# Patient Record
Sex: Female | Born: 1995 | Race: White | Hispanic: No | Marital: Single | Smoking: Never smoker
Health system: Southern US, Community
[De-identification: ages and names within clinical notes are randomized; demographics above are authoritative.]

## PROBLEM LIST (undated history)

## (undated) DIAGNOSIS — K5909 Other constipation: Secondary | ICD-10-CM

## (undated) DIAGNOSIS — J45909 Unspecified asthma, uncomplicated: Secondary | ICD-10-CM

## (undated) DIAGNOSIS — R197 Diarrhea, unspecified: Secondary | ICD-10-CM

## (undated) DIAGNOSIS — M419 Scoliosis, unspecified: Secondary | ICD-10-CM

## (undated) DIAGNOSIS — F0781 Postconcussional syndrome: Secondary | ICD-10-CM

## (undated) DIAGNOSIS — N390 Urinary tract infection, site not specified: Secondary | ICD-10-CM

## (undated) DIAGNOSIS — G43909 Migraine, unspecified, not intractable, without status migrainosus: Secondary | ICD-10-CM

## (undated) DIAGNOSIS — Q796 Ehlers-Danlos syndrome, unspecified: Secondary | ICD-10-CM

## (undated) DIAGNOSIS — R109 Unspecified abdominal pain: Secondary | ICD-10-CM

## (undated) HISTORY — DX: Unspecified abdominal pain: R10.9

## (undated) HISTORY — DX: Scoliosis, unspecified: M41.9

## (undated) HISTORY — DX: Postconcussional syndrome: F07.81

## (undated) HISTORY — DX: Diarrhea, unspecified: R19.7

## (undated) HISTORY — DX: Ehlers-Danlos syndrome, unspecified: Q79.60

## (undated) HISTORY — DX: Other constipation: K59.09

---
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
## 2015-08-31 IMAGING — CR DG NECK SOFT TISSUE
1 series · 1 of 1 positions shown · non-contrast
Comparison: None.

CLINICAL DATA: Sore throat.

EXAM:
NECK SOFT TISSUES - 1+ VIEW

[w soft tissue neck]
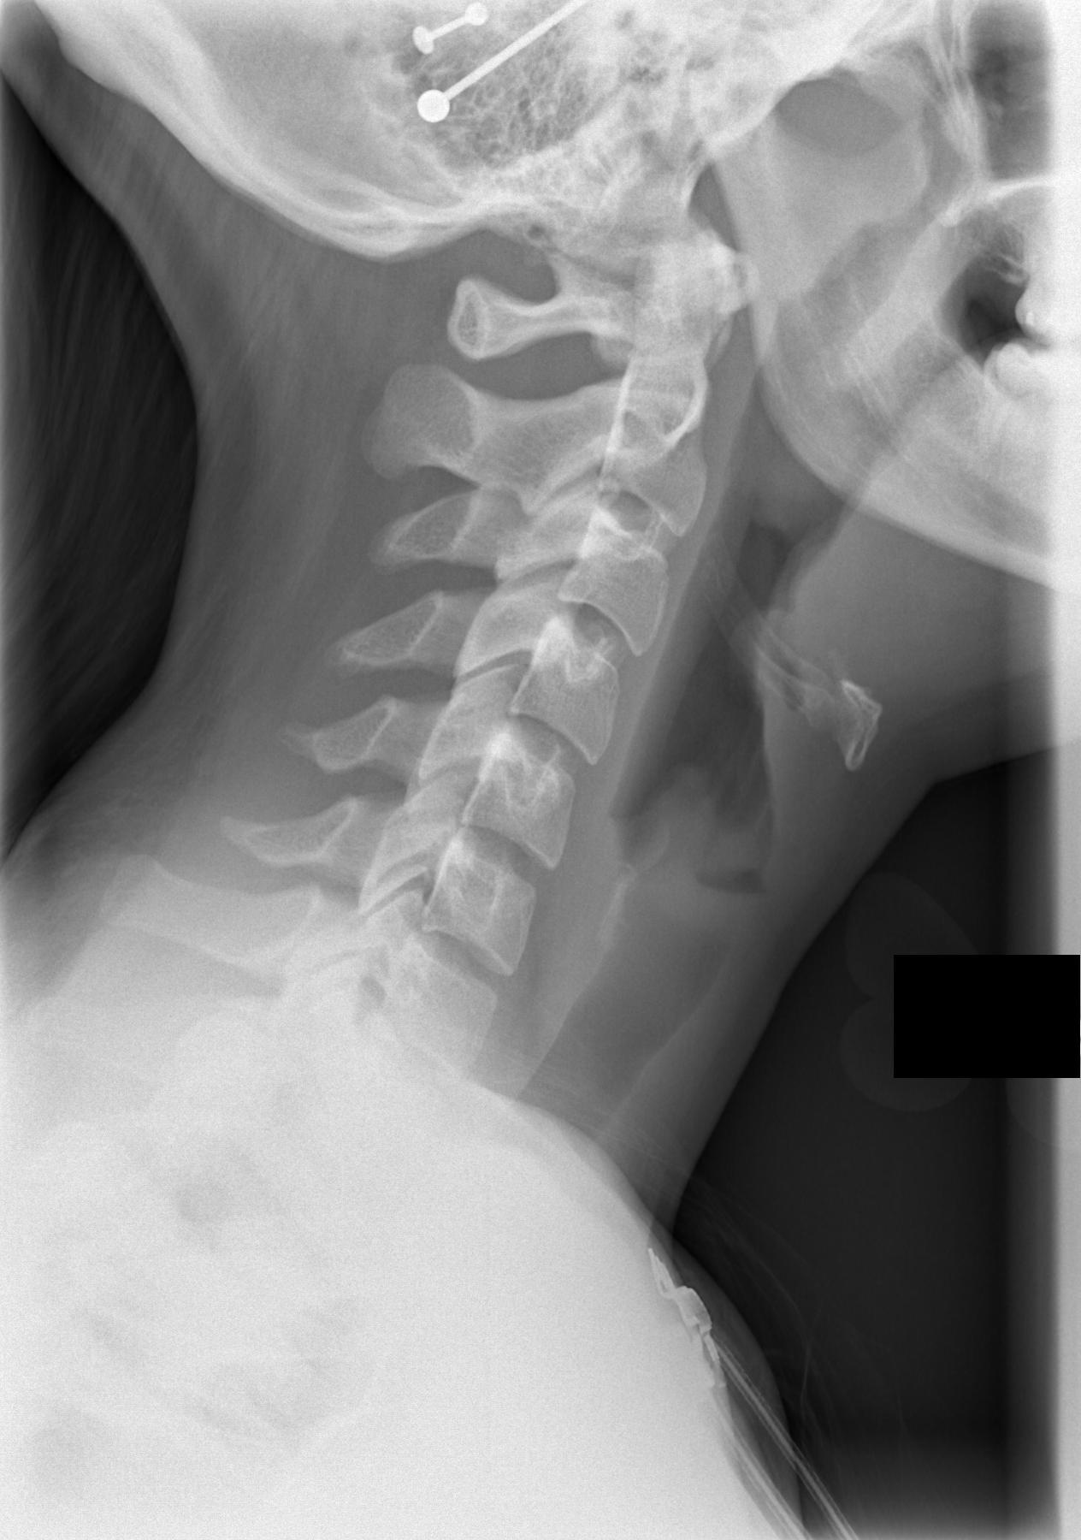

[1 of 1 positions shown; findings below may reference images not displayed]

FINDINGS: There is no evidence of retropharyngeal soft tissue swelling or
epiglottic enlargement. The cervical airway is unremarkable and no
radio-opaque foreign body identified.
IMPRESSION: Negative exam.

## 2015-09-18 ENCOUNTER — Other Ambulatory Visit: Payer: Self-pay | Admitting: Family Medicine

## 2015-09-18 DIAGNOSIS — N6320 Unspecified lump in the left breast, unspecified quadrant: Secondary | ICD-10-CM

## 2015-09-23 ENCOUNTER — Ambulatory Visit
Admission: RE | Admit: 2015-09-23 | Discharge: 2015-09-23 | Disposition: A | Discharge status: Home / Self Care | Payer: BLUE CROSS/BLUE SHIELD | Source: Ambulatory Visit | Attending: Family Medicine | Admitting: Family Medicine

## 2015-09-23 DIAGNOSIS — N6320 Unspecified lump in the left breast, unspecified quadrant: Secondary | ICD-10-CM

## 2016-05-12 DIAGNOSIS — M545 Low back pain: Secondary | ICD-10-CM | POA: Diagnosis not present

## 2016-05-12 DIAGNOSIS — M62830 Muscle spasm of back: Secondary | ICD-10-CM | POA: Diagnosis not present

## 2016-06-08 DIAGNOSIS — J029 Acute pharyngitis, unspecified: Secondary | ICD-10-CM | POA: Diagnosis not present

## 2016-06-10 DIAGNOSIS — M545 Low back pain: Secondary | ICD-10-CM | POA: Diagnosis not present

## 2016-06-10 DIAGNOSIS — M542 Cervicalgia: Secondary | ICD-10-CM | POA: Diagnosis not present

## 2016-08-23 DIAGNOSIS — Z114 Encounter for screening for human immunodeficiency virus [HIV]: Secondary | ICD-10-CM | POA: Diagnosis not present

## 2016-08-23 DIAGNOSIS — Z113 Encounter for screening for infections with a predominantly sexual mode of transmission: Secondary | ICD-10-CM | POA: Diagnosis not present

## 2016-08-23 DIAGNOSIS — Z3046 Encounter for surveillance of implantable subdermal contraceptive: Secondary | ICD-10-CM | POA: Diagnosis not present

## 2016-08-24 DIAGNOSIS — S71151A Open bite, right thigh, initial encounter: Secondary | ICD-10-CM | POA: Diagnosis not present

## 2016-09-16 DIAGNOSIS — N644 Mastodynia: Secondary | ICD-10-CM | POA: Diagnosis not present

## 2016-09-16 DIAGNOSIS — Z01411 Encounter for gynecological examination (general) (routine) with abnormal findings: Secondary | ICD-10-CM | POA: Diagnosis not present

## 2016-10-28 DIAGNOSIS — Z79899 Other long term (current) drug therapy: Secondary | ICD-10-CM | POA: Diagnosis not present

## 2016-10-28 DIAGNOSIS — D649 Anemia, unspecified: Secondary | ICD-10-CM | POA: Diagnosis not present

## 2016-10-28 DIAGNOSIS — R404 Transient alteration of awareness: Secondary | ICD-10-CM | POA: Diagnosis not present

## 2016-10-28 DIAGNOSIS — I10 Essential (primary) hypertension: Secondary | ICD-10-CM | POA: Diagnosis not present

## 2016-10-28 DIAGNOSIS — G43909 Migraine, unspecified, not intractable, without status migrainosus: Secondary | ICD-10-CM | POA: Diagnosis not present

## 2016-10-28 DIAGNOSIS — R4182 Altered mental status, unspecified: Secondary | ICD-10-CM | POA: Diagnosis not present

## 2016-11-16 DIAGNOSIS — G43909 Migraine, unspecified, not intractable, without status migrainosus: Secondary | ICD-10-CM | POA: Diagnosis not present

## 2016-12-09 DIAGNOSIS — R9401 Abnormal electroencephalogram [EEG]: Secondary | ICD-10-CM | POA: Diagnosis not present

## 2016-12-09 DIAGNOSIS — R404 Transient alteration of awareness: Secondary | ICD-10-CM | POA: Diagnosis not present

## 2016-12-09 DIAGNOSIS — G43909 Migraine, unspecified, not intractable, without status migrainosus: Secondary | ICD-10-CM | POA: Diagnosis not present

## 2016-12-09 DIAGNOSIS — R4182 Altered mental status, unspecified: Secondary | ICD-10-CM | POA: Diagnosis not present

## 2017-01-06 DIAGNOSIS — M25562 Pain in left knee: Secondary | ICD-10-CM | POA: Diagnosis not present

## 2017-01-17 DIAGNOSIS — M25562 Pain in left knee: Secondary | ICD-10-CM | POA: Diagnosis not present

## 2017-01-27 DIAGNOSIS — M25562 Pain in left knee: Secondary | ICD-10-CM | POA: Diagnosis not present

## 2017-02-07 DIAGNOSIS — M25562 Pain in left knee: Secondary | ICD-10-CM | POA: Diagnosis not present

## 2017-02-07 DIAGNOSIS — S83012D Lateral subluxation of left patella, subsequent encounter: Secondary | ICD-10-CM | POA: Diagnosis not present

## 2017-02-09 DIAGNOSIS — M25562 Pain in left knee: Secondary | ICD-10-CM | POA: Diagnosis not present

## 2017-02-09 DIAGNOSIS — S83012D Lateral subluxation of left patella, subsequent encounter: Secondary | ICD-10-CM | POA: Diagnosis not present

## 2017-02-17 DIAGNOSIS — M25562 Pain in left knee: Secondary | ICD-10-CM | POA: Diagnosis not present

## 2017-02-17 DIAGNOSIS — S83012D Lateral subluxation of left patella, subsequent encounter: Secondary | ICD-10-CM | POA: Diagnosis not present

## 2017-02-23 DIAGNOSIS — M25562 Pain in left knee: Secondary | ICD-10-CM | POA: Diagnosis not present

## 2017-02-23 DIAGNOSIS — S83012D Lateral subluxation of left patella, subsequent encounter: Secondary | ICD-10-CM | POA: Diagnosis not present

## 2017-03-15 DIAGNOSIS — R300 Dysuria: Secondary | ICD-10-CM | POA: Diagnosis not present

## 2017-03-15 DIAGNOSIS — B373 Candidiasis of vulva and vagina: Secondary | ICD-10-CM | POA: Diagnosis not present

## 2017-03-15 DIAGNOSIS — Z113 Encounter for screening for infections with a predominantly sexual mode of transmission: Secondary | ICD-10-CM | POA: Diagnosis not present

## 2017-03-15 DIAGNOSIS — Z3202 Encounter for pregnancy test, result negative: Secondary | ICD-10-CM | POA: Diagnosis not present

## 2017-04-19 DIAGNOSIS — R1084 Generalized abdominal pain: Secondary | ICD-10-CM | POA: Diagnosis not present

## 2017-04-19 DIAGNOSIS — R51 Headache: Secondary | ICD-10-CM | POA: Diagnosis not present

## 2017-04-24 ENCOUNTER — Other Ambulatory Visit: Payer: Self-pay | Admitting: Family Medicine

## 2017-04-24 DIAGNOSIS — R1084 Generalized abdominal pain: Secondary | ICD-10-CM

## 2017-05-05 ENCOUNTER — Ambulatory Visit
Admission: RE | Admit: 2017-05-05 | Discharge: 2017-05-05 | Disposition: A | Discharge status: Home / Self Care | Payer: BLUE CROSS/BLUE SHIELD | Source: Ambulatory Visit | Attending: Family Medicine | Admitting: Family Medicine

## 2017-05-05 DIAGNOSIS — R1084 Generalized abdominal pain: Secondary | ICD-10-CM | POA: Diagnosis not present

## 2017-05-11 DIAGNOSIS — R112 Nausea with vomiting, unspecified: Secondary | ICD-10-CM | POA: Diagnosis not present

## 2017-06-22 DIAGNOSIS — R4182 Altered mental status, unspecified: Secondary | ICD-10-CM | POA: Diagnosis not present

## 2017-06-22 DIAGNOSIS — Z79899 Other long term (current) drug therapy: Secondary | ICD-10-CM | POA: Diagnosis not present

## 2017-06-22 DIAGNOSIS — G43909 Migraine, unspecified, not intractable, without status migrainosus: Secondary | ICD-10-CM | POA: Diagnosis not present

## 2017-06-22 DIAGNOSIS — R9401 Abnormal electroencephalogram [EEG]: Secondary | ICD-10-CM | POA: Diagnosis not present

## 2017-06-22 DIAGNOSIS — R404 Transient alteration of awareness: Secondary | ICD-10-CM | POA: Diagnosis not present

## 2017-07-24 DIAGNOSIS — N390 Urinary tract infection, site not specified: Secondary | ICD-10-CM | POA: Diagnosis not present

## 2017-08-10 DIAGNOSIS — R4182 Altered mental status, unspecified: Secondary | ICD-10-CM | POA: Diagnosis not present

## 2017-08-10 DIAGNOSIS — R9401 Abnormal electroencephalogram [EEG]: Secondary | ICD-10-CM | POA: Diagnosis not present

## 2017-08-10 DIAGNOSIS — G43909 Migraine, unspecified, not intractable, without status migrainosus: Secondary | ICD-10-CM | POA: Diagnosis not present

## 2017-08-10 DIAGNOSIS — R404 Transient alteration of awareness: Secondary | ICD-10-CM | POA: Diagnosis not present

## 2017-08-31 DIAGNOSIS — R9401 Abnormal electroencephalogram [EEG]: Secondary | ICD-10-CM | POA: Diagnosis not present

## 2017-08-31 DIAGNOSIS — R4182 Altered mental status, unspecified: Secondary | ICD-10-CM | POA: Diagnosis not present

## 2017-08-31 DIAGNOSIS — R404 Transient alteration of awareness: Secondary | ICD-10-CM | POA: Diagnosis not present

## 2017-09-01 DIAGNOSIS — R4182 Altered mental status, unspecified: Secondary | ICD-10-CM | POA: Diagnosis not present

## 2017-09-01 DIAGNOSIS — R404 Transient alteration of awareness: Secondary | ICD-10-CM | POA: Diagnosis not present

## 2017-09-01 DIAGNOSIS — R9401 Abnormal electroencephalogram [EEG]: Secondary | ICD-10-CM | POA: Diagnosis not present

## 2017-09-02 DIAGNOSIS — R404 Transient alteration of awareness: Secondary | ICD-10-CM | POA: Diagnosis not present

## 2017-09-02 DIAGNOSIS — R4182 Altered mental status, unspecified: Secondary | ICD-10-CM | POA: Diagnosis not present

## 2017-09-02 DIAGNOSIS — R9401 Abnormal electroencephalogram [EEG]: Secondary | ICD-10-CM | POA: Diagnosis not present

## 2017-09-03 DIAGNOSIS — R404 Transient alteration of awareness: Secondary | ICD-10-CM | POA: Diagnosis not present

## 2017-09-03 DIAGNOSIS — R9401 Abnormal electroencephalogram [EEG]: Secondary | ICD-10-CM | POA: Diagnosis not present

## 2017-09-03 DIAGNOSIS — R4182 Altered mental status, unspecified: Secondary | ICD-10-CM | POA: Diagnosis not present

## 2017-09-04 DIAGNOSIS — R4182 Altered mental status, unspecified: Secondary | ICD-10-CM | POA: Diagnosis not present

## 2017-09-04 DIAGNOSIS — R9401 Abnormal electroencephalogram [EEG]: Secondary | ICD-10-CM | POA: Diagnosis not present

## 2017-09-04 DIAGNOSIS — R404 Transient alteration of awareness: Secondary | ICD-10-CM | POA: Diagnosis not present

## 2017-09-05 DIAGNOSIS — R404 Transient alteration of awareness: Secondary | ICD-10-CM | POA: Diagnosis not present

## 2017-09-05 DIAGNOSIS — R9401 Abnormal electroencephalogram [EEG]: Secondary | ICD-10-CM | POA: Diagnosis not present

## 2017-09-05 DIAGNOSIS — R4182 Altered mental status, unspecified: Secondary | ICD-10-CM | POA: Diagnosis not present

## 2017-09-06 DIAGNOSIS — R9401 Abnormal electroencephalogram [EEG]: Secondary | ICD-10-CM | POA: Diagnosis not present

## 2017-09-06 DIAGNOSIS — R404 Transient alteration of awareness: Secondary | ICD-10-CM | POA: Diagnosis not present

## 2017-09-06 DIAGNOSIS — R4182 Altered mental status, unspecified: Secondary | ICD-10-CM | POA: Diagnosis not present

## 2017-09-18 ENCOUNTER — Encounter (HOSPITAL_COMMUNITY): Payer: Self-pay

## 2017-09-18 ENCOUNTER — Emergency Department (HOSPITAL_COMMUNITY)
Admission: EM | Admit: 2017-09-18 | Discharge: 2017-09-18 | Disposition: A | Discharge status: Home / Self Care | Payer: BLUE CROSS/BLUE SHIELD | Attending: Emergency Medicine | Admitting: Emergency Medicine

## 2017-09-18 ENCOUNTER — Other Ambulatory Visit: Payer: Self-pay

## 2017-09-18 DIAGNOSIS — R404 Transient alteration of awareness: Secondary | ICD-10-CM | POA: Diagnosis not present

## 2017-09-18 DIAGNOSIS — R55 Syncope and collapse: Secondary | ICD-10-CM | POA: Diagnosis not present

## 2017-09-18 DIAGNOSIS — J45909 Unspecified asthma, uncomplicated: Secondary | ICD-10-CM | POA: Diagnosis not present

## 2017-09-18 DIAGNOSIS — Z79899 Other long term (current) drug therapy: Secondary | ICD-10-CM | POA: Diagnosis not present

## 2017-09-18 DIAGNOSIS — R569 Unspecified convulsions: Secondary | ICD-10-CM | POA: Diagnosis not present

## 2017-09-18 DIAGNOSIS — R42 Dizziness and giddiness: Secondary | ICD-10-CM | POA: Diagnosis not present

## 2017-09-18 LAB — URINALYSIS, ROUTINE W REFLEX MICROSCOPIC
Bilirubin Urine: NEGATIVE
Glucose, UA: NEGATIVE mg/dL
HGB URINE DIPSTICK: NEGATIVE
Ketones, ur: NEGATIVE mg/dL
LEUKOCYTES UA: NEGATIVE
Nitrite: NEGATIVE
Protein, ur: NEGATIVE mg/dL
SPECIFIC GRAVITY, URINE: 1.009 (ref 1.005–1.030)
pH: 9 — ABNORMAL HIGH (ref 5.0–8.0)

## 2017-09-18 LAB — BASIC METABOLIC PANEL
ANION GAP: 5 (ref 5–15)
BUN: 13 mg/dL (ref 6–20)
CALCIUM: 8.8 mg/dL — AB (ref 8.9–10.3)
CHLORIDE: 109 mmol/L (ref 101–111)
CO2: 24 mmol/L (ref 22–32)
Creatinine, Ser: 0.65 mg/dL (ref 0.44–1.00)
GFR calc non Af Amer: >60 mL/min (ref >60)
Glucose, Bld: 91 mg/dL (ref 65–99)
Potassium: 3.3 mmol/L — ABNORMAL LOW (ref 3.5–5.1)
Sodium: 138 mmol/L (ref 135–145)

## 2017-09-18 LAB — CBC
HCT: 37.1 % (ref 36.0–46.0)
HEMOGLOBIN: 12.4 g/dL (ref 12.0–15.0)
MCH: 29.2 pg (ref 26.0–34.0)
MCHC: 33.4 g/dL (ref 30.0–36.0)
MCV: 87.5 fL (ref 78.0–100.0)
Platelets: 220 10*3/uL (ref 150–400)
RBC: 4.24 MIL/uL (ref 3.87–5.11)
RDW: 13.2 % (ref 11.5–15.5)
WBC: 13.1 10*3/uL — ABNORMAL HIGH (ref 4.0–10.5)

## 2017-09-18 LAB — I-STAT BETA HCG BLOOD, ED (MC, WL, AP ONLY)

## 2017-09-18 LAB — CBG MONITORING, ED: GLUCOSE-CAPILLARY: 75 mg/dL (ref 65–99)

## 2017-09-18 MED ORDER — SODIUM CHLORIDE 0.9 % IV BOLUS (SEPSIS)
1000.0000 mL | Freq: Once | INTRAVENOUS | Status: AC
  Administered 2017-09-18: 1000 mL via INTRAVENOUS

## 2017-09-18 MED ORDER — POTASSIUM CHLORIDE CRYS ER 20 MEQ PO TBCR
20.0000 meq | EXTENDED_RELEASE_TABLET | Freq: Once | ORAL | Status: AC
  Administered 2017-09-18: 20 meq via ORAL
  Filled 2017-09-18: qty 1

## 2017-09-18 MED ORDER — ONDANSETRON HCL 4 MG/2ML IJ SOLN
4.0000 mg | Freq: Once | INTRAMUSCULAR | Status: AC
  Administered 2017-09-18: 4 mg via INTRAVENOUS
  Filled 2017-09-18: qty 2

## 2017-09-18 NOTE — ED Triage Notes (Signed)
[facility] Patient with history of pseudoseizures and taking her prescribed medications. Patient states she was in class at 1800 and stepped out "because I felt hot and didn't feel good." Patient woke up on the floor. Patient unsure how long she was unconscious. Patient unsure of head injury. Patient reports headache at this time. Patient received 1LNS by EMS. Patient had positive orthostatics for EMS with her HR increasing from 90 to 120 from sitting to standing. Patient pale in triage and states " I think I might pass out again" when asked to move to wheelchair. Patient placed in hall bed until room made available. Seizure precautions in place. Patient AxOx4 in triage.

## 2017-09-18 NOTE — ED Provider Notes (Addendum)
[facility]-EMERGENCY DEPT Provider Note   CSN: [id number] Arrival date & time: 09/18/17  1922     History   Chief Complaint Chief Complaint  Patient presents with  . Loss of Consciousness    HPI Renee Bowman is a 22 y.o. female.  HPI  22 year old female okays with history of cyclic vomiting and history of partial complex seizures.  Today while in class in college she felt acutely hot and nauseous and when leaving class she had a syncopal event.  She denies any injury from that.  She initially per the nursing report had a headache but currently only feels dizzy.  She was orthostatic in the field.  She states she has had one prior syncopal event a few months ago when she donated blood.  She also states that she is felt just generally sick since yesterday with body aches and sore throat.  She has been taking her medications regularly.  She does not think this episode was a seizure as that usually is a more of a staring off into space and some stuttering speech.  There is been no bowel or bladder incontinence.    Past Medical History:  Diagnosis Date  . Abdominal pain, recurrent   . Asthma with bronchitis    past hx 6 yrs ago  . Chronic constipation   . Chronic UTI 2013  . Diarrhea   . Ehler Danlos syndrome   . Migraine   . 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 Scoliosis     Patient Active Problem List   Diagnosis Date Noted  . Viral syndrome 08/15/2013  . Viral pharyngitis 08/15/2013  . Insomnia 07/31/2013  . Nightmares 07/31/2013  . 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 History of concussion 07/31/2013  . Ehler Danlos syndrome 07/14/2011  . Scoliosis   . Abdominal pain, recurrent   . Chronic constipation   . Diarrhea   . Post concussion syndrome     Past Surgical History:  Procedure Laterality Date  . APPENDECTOMY  2012    OB History    No data available       Home  Medications    Prior to Admission medications   Medication Sig Start Date End Date Taking? Authorizing Provider  cloNIDine (CATAPRES) 0.1 MG tablet Take 1/2 to 1 tablet 30 minutes before bedtime 07/31/13   Elveria Rising, NP  EPINEPHrine (EPIPEN JR) 0.15 MG/0.3ML injection Inject 0.15 mg into the muscle as needed.      [provider]  etonogestrel (NEXPLANON) 68 MG IMPL implant Inject 1 each into the skin once.    [provider]  ibuprofen (ADVIL,MOTRIN) 200 MG tablet Take 400 mg by mouth every 6 (six) hours as needed.      [provider]  oxyCODONE-acetaminophen (PERCOCET) 5-325 MG per tablet Take 1 tablet by mouth every 6 (six) hours as needed for moderate pain. 08/15/13   Purvis Sheffield, MD  SUMAtriptan (IMITREX) 50 MG tablet Take 50 mg by mouth every 2 (two) hours as needed.      [provider]    Family History Family History  Problem Relation Age of Onset  . Ulcerative colitis Mother   . Ulcers Maternal Grandmother     Social History Social History   Tobacco Use  . Smoking status: Never Smoker  . Smokeless tobacco: Never Used  Substance Use Topics  . Alcohol use: No  . Drug use: No  Allergies   Bee venom and Sulfa antibiotics   Review of Systems Review of Systems  Constitutional: Negative for chills and fever.  HENT: Negative for ear pain and sore throat.   Eyes: Negative for pain and visual disturbance.  Respiratory: Negative for cough and shortness of breath.   Cardiovascular: Negative for chest pain and palpitations.  Gastrointestinal: Negative for abdominal pain and vomiting.  Genitourinary: Negative for dysuria and hematuria.  Musculoskeletal: Negative for arthralgias and back pain.  Skin: Negative for color change and rash.  Neurological: Positive for dizziness, seizures and syncope.  All other systems reviewed and are negative.    Physical Exam Updated Vital Signs BP 126/70 (BP Location: Right Arm)    Pulse 85   Temp 97.8 F (36.6 C) (Oral)   Resp 18   LMP 08/28/2017   SpO2 100%   Physical Exam  Constitutional: She appears well-developed and well-nourished. No distress.  HENT:  Head: Normocephalic and atraumatic.  Eyes: Conjunctivae are normal.  Neck: Neck supple.  Cardiovascular: Normal rate and regular rhythm.  No murmur heard. Pulmonary/Chest: Effort normal and breath sounds normal. No respiratory distress.  Abdominal: Soft. There is no tenderness.  Musculoskeletal: Normal range of motion. She exhibits no edema or tenderness.  Neurological: She is alert.  Skin: Skin is warm and dry.  Psychiatric: She has a normal mood and affect.  Nursing note and vitals reviewed.    ED Treatments / Results  Labs (all labs ordered are listed, but only abnormal results are displayed) Labs Reviewed  BASIC METABOLIC PANEL - Abnormal; Notable for the following components:      Result Value   Potassium 3.3 (*)    Calcium 8.8 (*)    All other components within normal limits  CBC - Abnormal; Notable for the following components:   WBC 13.1 (*)    All other components within normal limits  URINALYSIS, ROUTINE W REFLEX MICROSCOPIC - Abnormal; Notable for the following components:   pH 9.0 (*)    All other components within normal limits  CBG MONITORING, ED  I-STAT BETA HCG BLOOD, ED ([facility], [facility], [facility] ONLY)    EKG  EKG Interpretation None     ECG - NSR, nl intervals, no acute st/ts - no prior available  Radiology No results found.  Procedures ED EKG Date/Time: 09/19/2017 5:38 PM Performed by: Terrilee FilesButler, Michael C, MD Authorized by: Terrilee FilesButler, Michael C, MD      (including critical care time)  Medications Ordered in ED Medications  sodium chloride 0.9 % bolus 1,000 mL (not administered)  ondansetron (ZOFRAN) injection 4 mg (not administered)     Initial Impression / Assessment and Plan / ED Course  I have reviewed the triage vital signs and the nursing notes.  Pertinent labs &  imaging results that were available during my care of the patient were reviewed by me and considered in my medical decision making (see chart for details).  Clinical Course as of Sep 19 1736  Mon Sep 18, 2017  2223 Reevaluated patient.  She has been up and ambulatory without any symptoms and she feels like she can go home.  [MB]    Clinical Course User Index [MB] Terrilee FilesButler, Michael C, MD    22 year old female with history of seizures here after an unresponsive episode.  Differential includes seizure, syncope, metabolic derangement, dehydration.  Her screening labs were reassuring and after IV fluids she was up and ambulatory without any complaints.  Her EKG was unremarkable with normal intervals.  She understands the need to follow-up with her primary care doctor and return if any worsening symptoms.  Final Clinical Impressions(s) / ED Diagnoses   Final diagnoses:  Syncope and collapse    ED Discharge Orders    None       Terrilee Files, MD 09/19/17 1741    Terrilee Files, MD 10/04/17 1816

## 2017-09-18 NOTE — Discharge Instructions (Signed)
Your evaluated in the emergency department for a fainting spell.  You likely were dehydrated and he felt improved after getting some IV fluids.  Your lab work was only significant for your potassium being slightly low.  We recommend you continue oral rehydration with electrolyte solutions like Gatorade and to follow-up with your doctor.  Please return if any worsening symptoms.

## 2017-09-21 DIAGNOSIS — R4182 Altered mental status, unspecified: Secondary | ICD-10-CM | POA: Diagnosis not present

## 2017-09-21 DIAGNOSIS — R9401 Abnormal electroencephalogram [EEG]: Secondary | ICD-10-CM | POA: Diagnosis not present

## 2017-09-21 DIAGNOSIS — R404 Transient alteration of awareness: Secondary | ICD-10-CM | POA: Diagnosis not present

## 2017-09-21 DIAGNOSIS — G43909 Migraine, unspecified, not intractable, without status migrainosus: Secondary | ICD-10-CM | POA: Diagnosis not present

## 2017-10-12 DIAGNOSIS — R112 Nausea with vomiting, unspecified: Secondary | ICD-10-CM | POA: Diagnosis not present

## 2017-10-12 DIAGNOSIS — K219 Gastro-esophageal reflux disease without esophagitis: Secondary | ICD-10-CM | POA: Diagnosis not present

## 2017-10-12 DIAGNOSIS — K5900 Constipation, unspecified: Secondary | ICD-10-CM | POA: Diagnosis not present

## 2017-10-27 DIAGNOSIS — K219 Gastro-esophageal reflux disease without esophagitis: Secondary | ICD-10-CM | POA: Diagnosis not present

## 2017-10-27 DIAGNOSIS — R112 Nausea with vomiting, unspecified: Secondary | ICD-10-CM | POA: Diagnosis not present

## 2017-10-27 DIAGNOSIS — K2970 Gastritis, unspecified, without bleeding: Secondary | ICD-10-CM | POA: Diagnosis not present

## 2017-10-27 DIAGNOSIS — K3189 Other diseases of stomach and duodenum: Secondary | ICD-10-CM | POA: Diagnosis not present

## 2017-12-04 DIAGNOSIS — L709 Acne, unspecified: Secondary | ICD-10-CM | POA: Diagnosis not present

## 2017-12-04 DIAGNOSIS — L568 Other specified acute skin changes due to ultraviolet radiation: Secondary | ICD-10-CM | POA: Diagnosis not present

## 2017-12-13 DIAGNOSIS — R109 Unspecified abdominal pain: Secondary | ICD-10-CM | POA: Diagnosis not present

## 2017-12-13 DIAGNOSIS — N390 Urinary tract infection, site not specified: Secondary | ICD-10-CM | POA: Diagnosis not present

## 2017-12-13 DIAGNOSIS — R252 Cramp and spasm: Secondary | ICD-10-CM | POA: Diagnosis not present

## 2017-12-13 DIAGNOSIS — R1011 Right upper quadrant pain: Secondary | ICD-10-CM | POA: Diagnosis not present

## 2017-12-13 DIAGNOSIS — G8929 Other chronic pain: Secondary | ICD-10-CM | POA: Diagnosis not present

## 2018-01-01 DIAGNOSIS — R3915 Urgency of urination: Secondary | ICD-10-CM | POA: Diagnosis not present

## 2018-01-01 DIAGNOSIS — N941 Unspecified dyspareunia: Secondary | ICD-10-CM | POA: Diagnosis not present

## 2018-01-01 DIAGNOSIS — N390 Urinary tract infection, site not specified: Secondary | ICD-10-CM | POA: Diagnosis not present

## 2018-01-01 DIAGNOSIS — R350 Frequency of micturition: Secondary | ICD-10-CM | POA: Diagnosis not present

## 2018-02-01 DIAGNOSIS — N390 Urinary tract infection, site not specified: Secondary | ICD-10-CM | POA: Diagnosis not present

## 2018-02-01 DIAGNOSIS — R3129 Other microscopic hematuria: Secondary | ICD-10-CM | POA: Diagnosis not present

## 2018-02-01 DIAGNOSIS — N3000 Acute cystitis without hematuria: Secondary | ICD-10-CM | POA: Diagnosis not present

## 2018-02-02 DIAGNOSIS — G43909 Migraine, unspecified, not intractable, without status migrainosus: Secondary | ICD-10-CM | POA: Diagnosis not present

## 2018-02-02 DIAGNOSIS — R404 Transient alteration of awareness: Secondary | ICD-10-CM | POA: Diagnosis not present

## 2018-02-02 DIAGNOSIS — R9401 Abnormal electroencephalogram [EEG]: Secondary | ICD-10-CM | POA: Diagnosis not present

## 2018-02-02 DIAGNOSIS — R4182 Altered mental status, unspecified: Secondary | ICD-10-CM | POA: Diagnosis not present

## 2018-02-07 DIAGNOSIS — R569 Unspecified convulsions: Secondary | ICD-10-CM | POA: Diagnosis not present

## 2018-02-08 DIAGNOSIS — N390 Urinary tract infection, site not specified: Secondary | ICD-10-CM | POA: Diagnosis not present

## 2018-02-08 DIAGNOSIS — N3281 Overactive bladder: Secondary | ICD-10-CM | POA: Diagnosis not present

## 2018-02-08 DIAGNOSIS — B373 Candidiasis of vulva and vagina: Secondary | ICD-10-CM | POA: Diagnosis not present

## 2018-02-08 DIAGNOSIS — N398 Other specified disorders of urinary system: Secondary | ICD-10-CM | POA: Diagnosis not present

## 2018-02-13 DIAGNOSIS — N398 Other specified disorders of urinary system: Secondary | ICD-10-CM | POA: Diagnosis not present

## 2018-02-13 DIAGNOSIS — M545 Low back pain: Secondary | ICD-10-CM | POA: Diagnosis not present

## 2018-02-13 DIAGNOSIS — M546 Pain in thoracic spine: Secondary | ICD-10-CM | POA: Diagnosis not present

## 2018-02-21 DIAGNOSIS — M545 Low back pain: Secondary | ICD-10-CM | POA: Diagnosis not present

## 2018-02-21 DIAGNOSIS — N398 Other specified disorders of urinary system: Secondary | ICD-10-CM | POA: Diagnosis not present

## 2018-02-21 DIAGNOSIS — M546 Pain in thoracic spine: Secondary | ICD-10-CM | POA: Diagnosis not present

## 2018-03-07 DIAGNOSIS — Z713 Dietary counseling and surveillance: Secondary | ICD-10-CM | POA: Diagnosis not present

## 2018-03-14 DIAGNOSIS — Z713 Dietary counseling and surveillance: Secondary | ICD-10-CM | POA: Diagnosis not present

## 2018-03-14 DIAGNOSIS — M546 Pain in thoracic spine: Secondary | ICD-10-CM | POA: Diagnosis not present

## 2018-03-14 DIAGNOSIS — M545 Low back pain: Secondary | ICD-10-CM | POA: Diagnosis not present

## 2018-03-14 DIAGNOSIS — N398 Other specified disorders of urinary system: Secondary | ICD-10-CM | POA: Diagnosis not present

## 2018-03-16 DIAGNOSIS — M545 Low back pain: Secondary | ICD-10-CM | POA: Diagnosis not present

## 2018-03-16 DIAGNOSIS — M792 Neuralgia and neuritis, unspecified: Secondary | ICD-10-CM | POA: Diagnosis not present

## 2018-03-16 DIAGNOSIS — R4182 Altered mental status, unspecified: Secondary | ICD-10-CM | POA: Diagnosis not present

## 2018-03-16 DIAGNOSIS — G43909 Migraine, unspecified, not intractable, without status migrainosus: Secondary | ICD-10-CM | POA: Diagnosis not present

## 2018-04-04 DIAGNOSIS — Z713 Dietary counseling and surveillance: Secondary | ICD-10-CM | POA: Diagnosis not present

## 2018-04-04 DIAGNOSIS — N398 Other specified disorders of urinary system: Secondary | ICD-10-CM | POA: Diagnosis not present

## 2018-04-04 DIAGNOSIS — M546 Pain in thoracic spine: Secondary | ICD-10-CM | POA: Diagnosis not present

## 2018-04-04 DIAGNOSIS — M545 Low back pain: Secondary | ICD-10-CM | POA: Diagnosis not present

## 2018-10-24 DIAGNOSIS — Z1159 Encounter for screening for other viral diseases: Secondary | ICD-10-CM | POA: Diagnosis not present

## 2018-10-24 DIAGNOSIS — R69 Illness, unspecified: Secondary | ICD-10-CM | POA: Diagnosis not present

## 2018-10-24 DIAGNOSIS — R0981 Nasal congestion: Secondary | ICD-10-CM | POA: Diagnosis not present

## 2018-10-24 DIAGNOSIS — R1084 Generalized abdominal pain: Secondary | ICD-10-CM | POA: Diagnosis not present

## 2018-10-24 DIAGNOSIS — J029 Acute pharyngitis, unspecified: Secondary | ICD-10-CM | POA: Diagnosis not present

## 2018-10-24 DIAGNOSIS — Z3202 Encounter for pregnancy test, result negative: Secondary | ICD-10-CM | POA: Diagnosis not present

## 2018-10-24 DIAGNOSIS — R112 Nausea with vomiting, unspecified: Secondary | ICD-10-CM | POA: Diagnosis not present

## 2018-10-30 DIAGNOSIS — R300 Dysuria: Secondary | ICD-10-CM | POA: Diagnosis not present

## 2018-11-22 IMAGING — US US ABDOMEN COMPLETE
1 series · 14 of 25 positions shown · non-contrast
Comparison: CT abdomen and pelvis 02/14/2011

CLINICAL DATA: Generalized abdominal pain for 3 weeks, nausea,
vomiting, diarrhea, history of asthma, Laide syndrome, prior
appendectomy

EXAM:
ABDOMEN ULTRASOUND COMPLETE

[Series 1: us abdomen complete · 0.14mm/px · 14 of 78 slices shown]
[im 1/78]
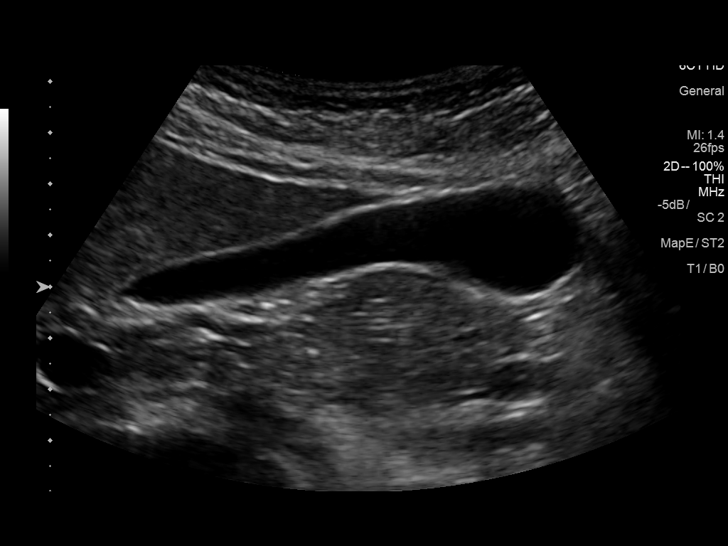
[im 7/78]
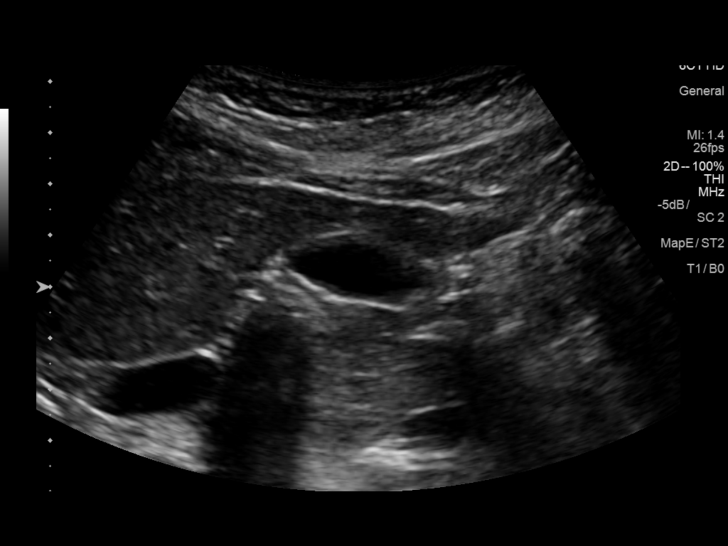
[im 13/78]
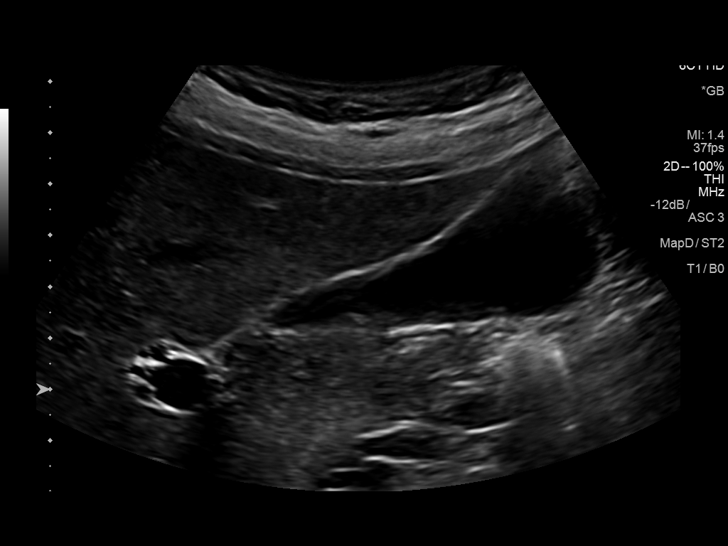
[im 20/78]
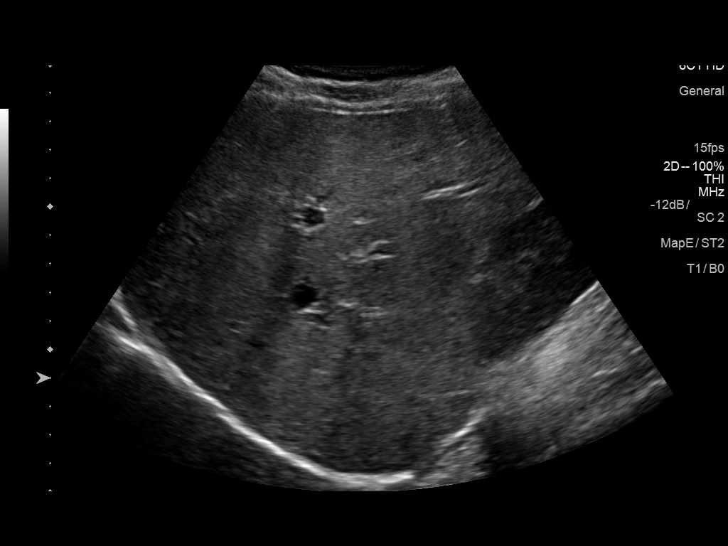
[im 26/78]
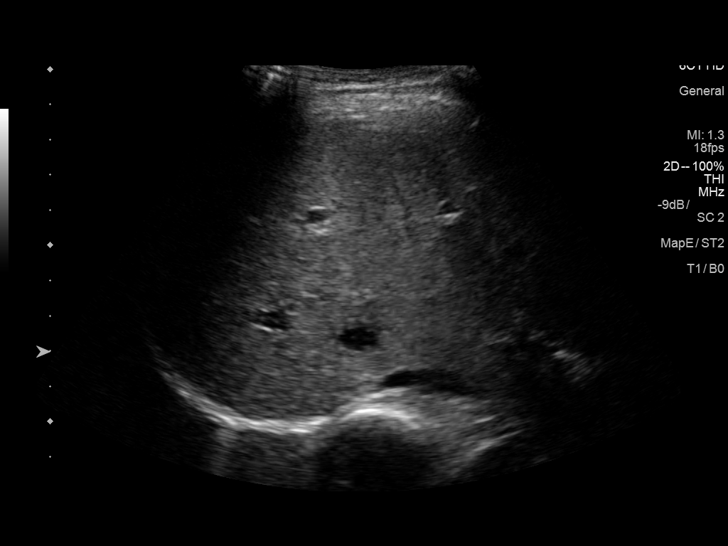
[im 29/78]
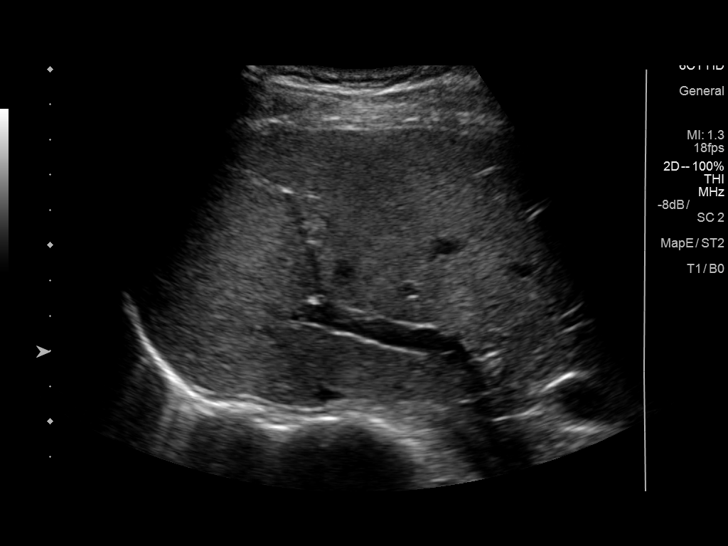
[im 36/78]
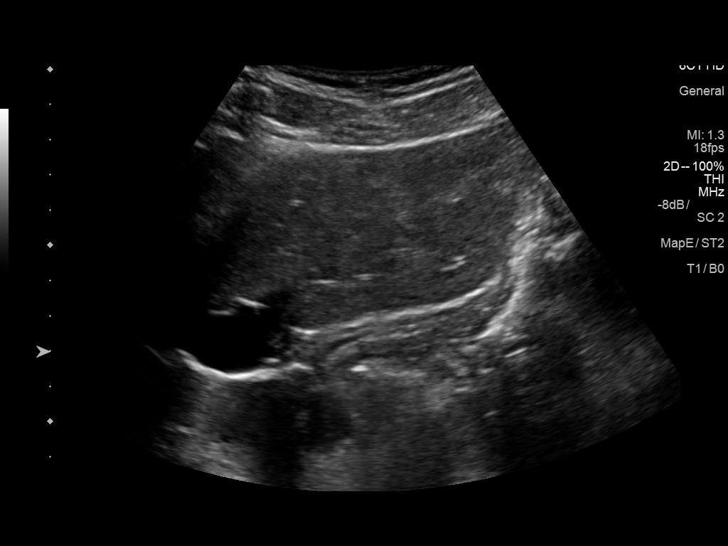
[im 42/78]
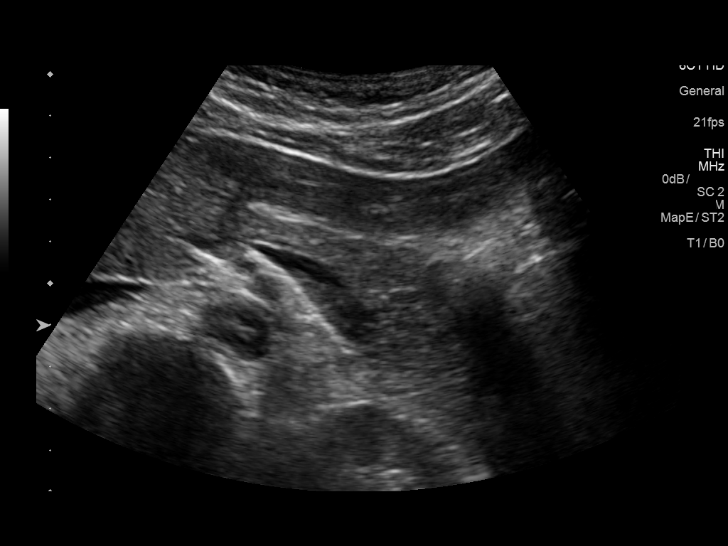
[im 49/78]
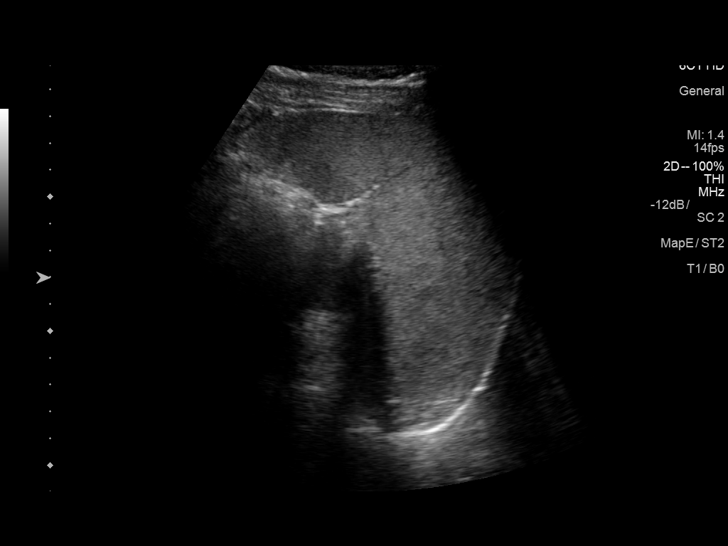
[im 52/78]
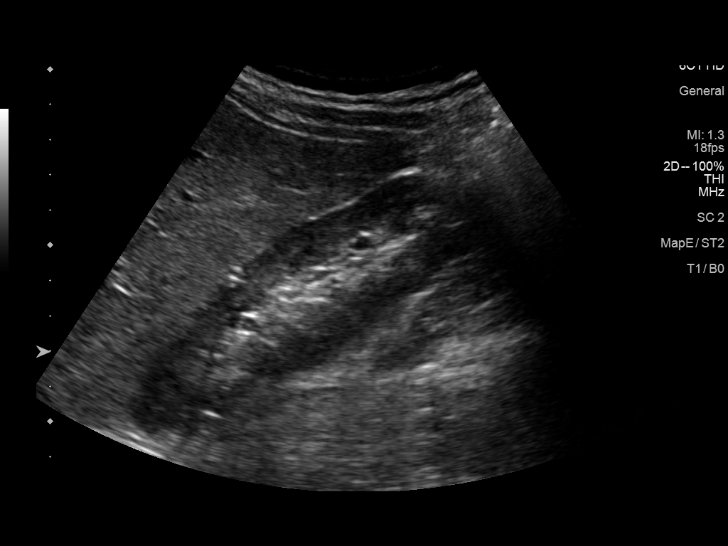
[im 58/78]
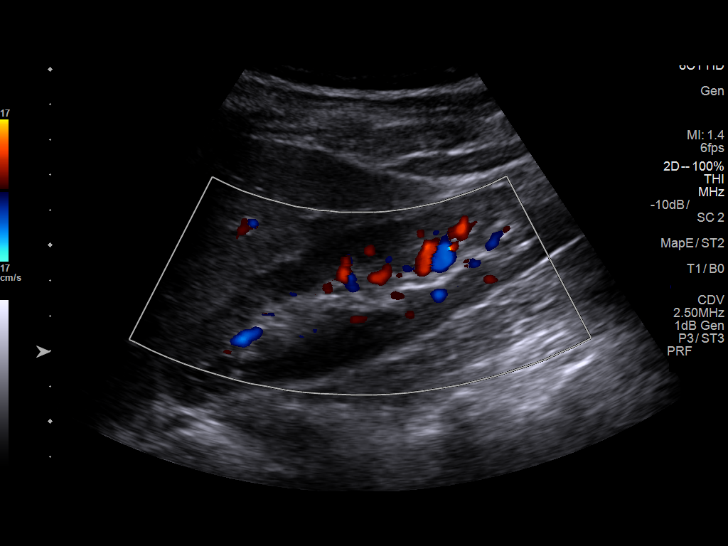
[im 65/78]
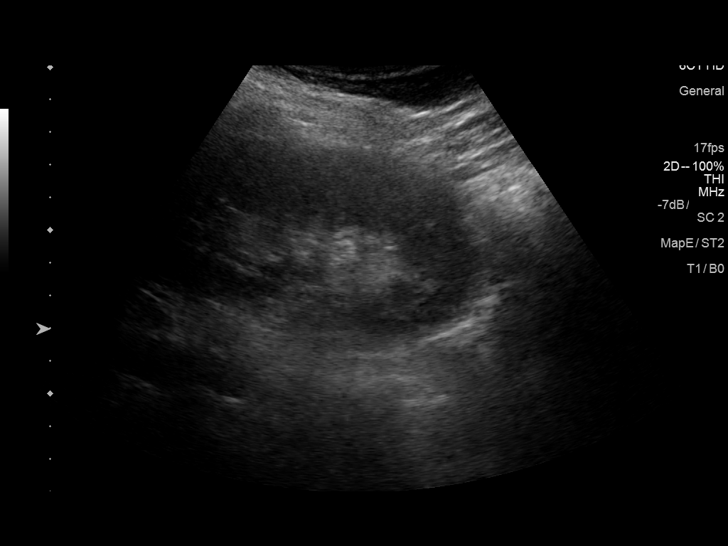
[im 71/78]
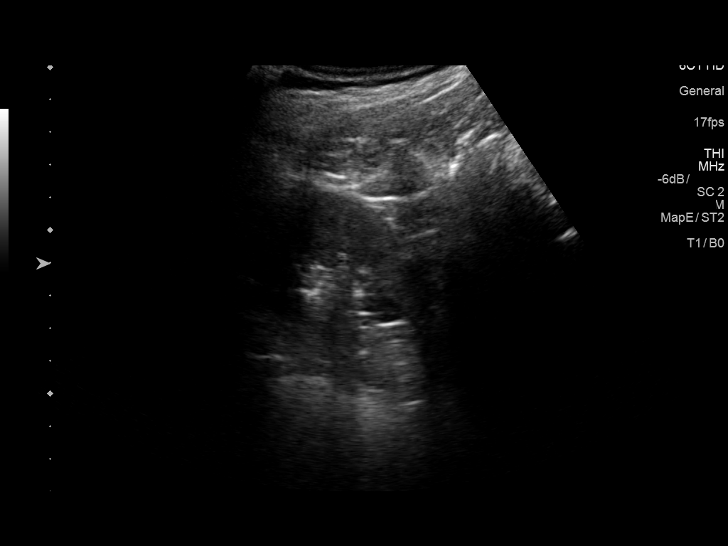
[im 78/78]
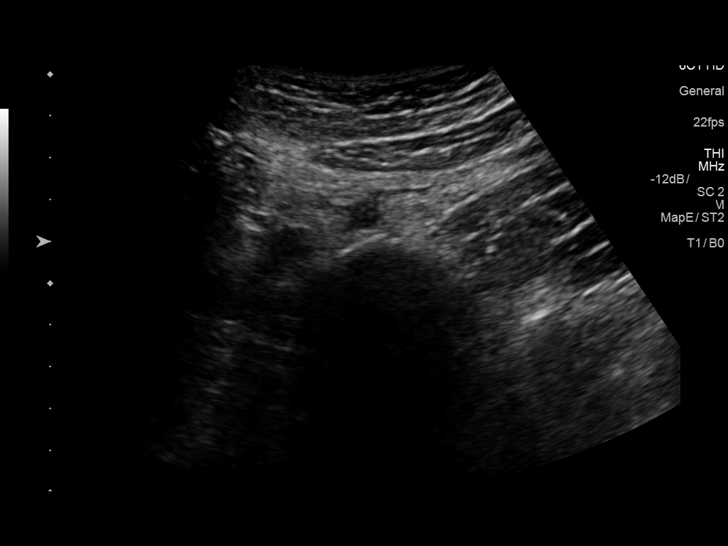

[14 of 25 positions shown; findings below may reference images not displayed]

FINDINGS: Gallbladder: Normally distended without stones or wall thickening.
No pericholecystic fluid or sonographic Murphy sign.

Common bile duct: Diameter: Normal caliber 2 mm diameter, normal

Liver: Normal appearance Portal vein is patent on color Doppler
imaging with normal direction of blood flow towards the liver.

IVC: Normal appearance

Pancreas: Normal appearance

Spleen: Normal appearance, 9.4 cm length

Right Kidney: Length: 12.2 cm. Normal cortical thickness and
echogenicity. No mass, hydronephrosis or shadowing calcification.

Left Kidney: Length: 12.5 cm. Normal cortical thickness and
echogenicity. No mass, hydronephrosis or shadowing calcification.

Abdominal aorta: Normal caliber

Other findings: No free fluid
IMPRESSION: Normal exam.

## 2018-12-10 DIAGNOSIS — Z113 Encounter for screening for infections with a predominantly sexual mode of transmission: Secondary | ICD-10-CM | POA: Diagnosis not present

## 2018-12-10 DIAGNOSIS — R350 Frequency of micturition: Secondary | ICD-10-CM | POA: Diagnosis not present

## 2018-12-10 DIAGNOSIS — N760 Acute vaginitis: Secondary | ICD-10-CM | POA: Diagnosis not present

## 2020-08-22 ENCOUNTER — Other Ambulatory Visit: Payer: BLUE CROSS/BLUE SHIELD

## 2020-08-22 DIAGNOSIS — Z20822 Contact with and (suspected) exposure to covid-19: Secondary | ICD-10-CM

## 2020-08-25 LAB — NOVEL CORONAVIRUS, NAA: SARS-CoV-2, NAA: NOT DETECTED
